# Patient Record
Sex: Male | Born: 1960 | Race: Asian | Hispanic: No | Marital: Married | Smoking: Current every day smoker
Health system: Southern US, Community
[De-identification: ages and names within clinical notes are randomized; demographics above are authoritative.]

## PROBLEM LIST (undated history)

## (undated) HISTORY — PX: NO PAST SURGERIES: SHX2092

---

## 2015-01-15 DIAGNOSIS — L03119 Cellulitis of unspecified part of limb: Secondary | ICD-10-CM

## 2015-01-15 DIAGNOSIS — R791 Abnormal coagulation profile: Secondary | ICD-10-CM

## 2015-01-15 DIAGNOSIS — W5911XA Bitten by nonvenomous snake, initial encounter: Secondary | ICD-10-CM

## 2015-01-15 HISTORY — DX: Abnormal coagulation profile: R79.1

## 2015-01-15 HISTORY — DX: Cellulitis of unspecified part of limb: L03.119

## 2015-01-15 HISTORY — DX: Bitten by nonvenomous snake, initial encounter: W59.11XA

## 2021-01-01 ENCOUNTER — Other Ambulatory Visit: Payer: Self-pay

## 2021-01-02 ENCOUNTER — Ambulatory Visit: Payer: BC Managed Care – PPO | Admitting: Medical

## 2021-01-02 ENCOUNTER — Other Ambulatory Visit (HOSPITAL_BASED_OUTPATIENT_CLINIC_OR_DEPARTMENT_OTHER): Payer: Self-pay

## 2021-01-02 VITALS — BP 137/70 | HR 49 | Resp 18 | Ht 65.0 in | Wt 119.0 lb

## 2021-01-02 DIAGNOSIS — Z23 Encounter for immunization: Secondary | ICD-10-CM | POA: Diagnosis not present

## 2021-01-02 DIAGNOSIS — Z1159 Encounter for screening for other viral diseases: Secondary | ICD-10-CM

## 2021-01-02 DIAGNOSIS — Z0000 Encounter for general adult medical examination without abnormal findings: Secondary | ICD-10-CM

## 2021-01-02 DIAGNOSIS — R001 Bradycardia, unspecified: Secondary | ICD-10-CM

## 2021-01-02 DIAGNOSIS — Z125 Encounter for screening for malignant neoplasm of prostate: Secondary | ICD-10-CM

## 2021-01-02 DIAGNOSIS — Z113 Encounter for screening for infections with a predominantly sexual mode of transmission: Secondary | ICD-10-CM

## 2021-01-02 DIAGNOSIS — Z1211 Encounter for screening for malignant neoplasm of colon: Secondary | ICD-10-CM

## 2021-01-02 DIAGNOSIS — F17200 Nicotine dependence, unspecified, uncomplicated: Secondary | ICD-10-CM | POA: Diagnosis not present

## 2021-01-02 DIAGNOSIS — H538 Other visual disturbances: Secondary | ICD-10-CM

## 2021-01-02 DIAGNOSIS — R9431 Abnormal electrocardiogram [ECG] [EKG]: Secondary | ICD-10-CM

## 2021-01-02 LAB — CBC WITH DIFFERENTIAL/PLATELET
Basophils Absolute: 0 10*3/uL (ref 0.0–0.1)
Basophils Relative: 0.3 % (ref 0.0–3.0)
Eosinophils Absolute: 0.1 10*3/uL (ref 0.0–0.7)
Eosinophils Relative: 1.7 % (ref 0.0–5.0)
HCT: 39.9 % (ref 39.0–52.0)
Hemoglobin: 13.3 g/dL (ref 13.0–17.0)
Lymphocytes Relative: 43.9 % (ref 12.0–46.0)
Lymphs Abs: 2.1 10*3/uL (ref 0.7–4.0)
MCHC: 33.3 g/dL (ref 30.0–36.0)
MCV: 84.2 fl (ref 78.0–100.0)
Monocytes Absolute: 0.4 10*3/uL (ref 0.1–1.0)
Monocytes Relative: 8.8 % (ref 3.0–12.0)
Neutro Abs: 2.2 10*3/uL (ref 1.4–7.7)
Neutrophils Relative %: 45.3 % (ref 43.0–77.0)
Platelets: 311 10*3/uL (ref 150.0–400.0)
RBC: 4.74 Mil/uL (ref 4.22–5.81)
RDW: 14.3 % (ref 11.5–15.5)
WBC: 4.8 10*3/uL (ref 4.0–10.5)

## 2021-01-02 LAB — LIPID PANEL
Cholesterol: 181 mg/dL (ref 0–200)
HDL: 65.7 mg/dL (ref >39.00)
LDL Cholesterol: 103 mg/dL — ABNORMAL HIGH (ref 0–99)
NonHDL: 115.56
Total CHOL/HDL Ratio: 3
Triglycerides: 62 mg/dL (ref 0.0–149.0)
VLDL: 12.4 mg/dL (ref 0.0–40.0)

## 2021-01-02 LAB — COMPREHENSIVE METABOLIC PANEL
ALT: 18 U/L (ref 0–53)
AST: 22 U/L (ref 0–37)
Albumin: 4.2 g/dL (ref 3.5–5.2)
Alkaline Phosphatase: 48 U/L (ref 39–117)
BUN: 12 mg/dL (ref 6–23)
CO2: 28 mEq/L (ref 19–32)
Calcium: 9.1 mg/dL (ref 8.4–10.5)
Chloride: 105 mEq/L (ref 96–112)
Creatinine, Ser: 0.79 mg/dL (ref 0.40–1.50)
GFR: 96.3 mL/min (ref >60.00)
Glucose, Bld: 86 mg/dL (ref 70–99)
Potassium: 4.9 mEq/L (ref 3.5–5.1)
Sodium: 139 mEq/L (ref 135–145)
Total Bilirubin: 0.6 mg/dL (ref 0.2–1.2)
Total Protein: 7.3 g/dL (ref 6.0–8.3)

## 2021-01-02 LAB — TROPONIN I (HIGH SENSITIVITY): High Sens Troponin I: 6 ng/L (ref 2–17)

## 2021-01-02 LAB — PSA: PSA: 1.26 ng/mL (ref 0.10–4.00)

## 2021-01-02 MED ORDER — NICOTINE 14 MG/24HR TD PT24
14.0000 mg | MEDICATED_PATCH | Freq: Every day | TRANSDERMAL | 0 refills | Status: DC
  Filled 2021-01-02: qty 28, 28d supply, fill #0

## 2021-01-02 NOTE — Addendum Note (Signed)
Addended by: Maximino Sarin on: 01/02/2021 10:48 AM   Modules accepted: Orders

## 2021-01-02 NOTE — Progress Notes (Signed)
trop  Subjective:    Patient ID: Donald Nichols, male    DOB: 16-Nov-1960, 60 y.o.   MRN: [id number]  HPI  Pt in for first time. Pt being seen in with vietnamese interpretor.  Pt is fasting.   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  No major medical problems. No meds. No regular exercise. Pt describes moderate healthy diet. Coffee every morning. Cigarrettes- 20 year smoker 1/2 pack a day. 5-6 beers a weeks(on weekends)   On review of systems. He states some blurred vision. No eye pain or pressure. This has been case for 2 years.  Never had colonoscopy.   Pt can't remember when last tetanus was.   Review of Systems  Constitutional:  Negative for chills, fatigue and fever.  HENT:  Negative for congestion, ear discharge, ear pain, postnasal drip, sinus pressure and sinus pain.   Eyes:        Rt eye blurred vision for 2 years.  Respiratory:  Negative for cough, chest tightness, shortness of breath and wheezing.   Cardiovascular:  Negative for chest pain and palpitations.  Gastrointestinal:  Negative for abdominal pain, blood in stool, constipation, nausea and vomiting.  Genitourinary:  Negative for difficulty urinating, flank pain and frequency.  Musculoskeletal:  Negative for arthralgias and gait problem.  Skin:  Negative for rash.    No past medical history on file.   Social History   Socioeconomic History   Marital status: Married    Spouse name: Not on file   Number of children: Not on file   Years of education: Not on file   Highest education level: Not on file  Occupational History   Not on file  Tobacco Use   Smoking status: Not on file   Smokeless tobacco: Not on file  Substance and Sexual Activity   Alcohol use: Not on file   Drug use: Not on file   Sexual activity: Not on file  Other Topics Concern   Not on file  Social History Narrative   Not on file   Social Determinants of Health   Financial Resource Strain: Not on file  Food Insecurity: Not on file   Transportation Needs: Not on file  Physical Activity: Not on file  Stress: Not on file  Social Connections: Not on file  Intimate Partner Violence: Not on file      No family history on file.  Not on File  No current outpatient medications on file prior to visit.   No current facility-administered medications on file prior to visit.    BP (!) 141/80   Pulse (!) 41   Resp 18   Ht 5\' 5"  (1.651 m)   Wt 119 lb (54 kg)   SpO2 99%   BMI 19.80 kg/m       Objective:   Physical Exam  General Mental Status- Alert. General Appearance- Not in acute distress.   Skin General: Color- Normal Color. Moisture- Normal Moisture.  Neck Carotid Arteries- Normal color. Moisture- Normal Moisture. No carotid bruits. No JVD.  Chest and Lung Exam Auscultation: Breath Sounds:-Normal.  Cardiovascular Auscultation:Rythm- Regular. Murmurs & Other Heart Sounds:Auscultation of the heart reveals- No Murmurs.  Abdomen Inspection:-Inspeection Normal. Palpation/Percussion:Note:No mass. Palpation and Percussion of the abdomen reveal- Non Tender, Non Distended + BS, no rebound or guarding.    Neurologic Cranial Nerve exam:- CN III-XII intact(No nystagmus), symmetric smile. Drift Test:- No drift. Romberg Exam:- Negative.  Heal to Toe Gait exam:-Normal. Finger to Nose:- Normal/Intact Strength:- 5/5 equal and symmetric strength  both upper and lower extremities.       Assessment & Plan:  For you wellness exam today I have ordered cbc, cmp, psa and lipid panel.  Vaccine given today. Tdap.  Recommend exercise and healthy diet.  We will let you know lab results as they come in.  Follow up date appointment will be determined after lab review.    Refer to gi for screening colonoscopy.  Counseled to quit smoking. Nicotine patch rx.  Refer to optometrist for vision/evaluate rt eye.   Bradycardia on ekg. Walking some increase to 49    [employer], PA-C  Time spent with patient  today was 50  minutes which consisted of chart review, discussing diagnosis, work up, treatment and documentation.

## 2021-01-02 NOTE — Patient Instructions (Addendum)
For you wellness exam today I have ordered cbc, cmp, psa and lipid panel.  Vaccine given today. Tdap.  Recommend exercise and healthy diet.  We will let you know lab results as they come in.  Follow up date appointment will be determined after lab review.    Refer to gi for screening colonoscopy.  Counseled to quit smoking. Nicotine patch rx.  Refer to optometrist for vision/evaluate rt eye.   Bradycardia on ekg. Walking some increase to 49  sinus bradycardia. Poor r wave progression. No prior to compare. No cardiac symptoms. Will include troponin non lab today. Will also go ahead and refer to cardiologist since hr only wne to 49 walking down hall.     Ch?m [place] phng ng?a 61-64 tu?i, Nam gi?i [employer]ive Care 34-61 Years Old, Male Ch?m [place] phng ng?a ?? c?p ??n cc l?a ch?n l?i s?ng v cc l?n khm v?i chuyn gia ch?m [place] s?c kh?e c th? t?ng c??ng s?c kh?e v h?nh phc. Nh?ng vi?c ny bao g?m: Khm th?c th? hng n?m. L?n khm ny c?ng ???c g?i l ki?m tra s?c kh?e hng n?m. Khm nha khoa v khm m?t th??ng xuyn. Ch?ng ng?a. Sng l?c m?t s? tnh tr?ng nh?t ??nh. L?a ch?n l?i s?ng lnh m?nh, ch?ng h?n nh?: ?n ch? ?? ?n lnh m?nh. T?p th? d?c th??ng xuyn. Khng s? d?ng ma ty ho?c cc s?n ph?m ch?a nicotine v thu?c l. H?n ch? s? d?ng r??u. Ti c th? d? ki?n nh?ng g ??i v?i l?n khm ch?m [place] phng ng?a c?a ti? Khm th?c th? Chuyn gia ch?m [place] s?c kh?e s? ki?m tra qu v? v?: Chi?u cao v cn n?ng. Nh?ng thng s? ny c th? ???c s? d?ng ?? tnh ton BMI (ch? s? kh?i c? th?) c?a qu v?. BMI l m?t s? ?o cho bi?t qu v? c cn n?ng t?t cho s?c kh?e hay khng. Nh?p tim v huy?t p. Thn nhi?t. Da xem c cc ??m b?t th??ng hay khng. T? v?n Chuyn gia ch?m [place] s?c kh?e c th? h?i qu v? v?: Cc v?n ?? b?nh l tr??c ?y. B?nh s? c?a gia ?nh. S? d?ng r??u, thu?c l v ma ty. Tnh tr?ng s?c kh?e tinh th?n. Cu?c s?ng ? nh v tnh tr?ng h?nh phc trong m?i quan h?. Quan h?  tnh d?c. Cc thi quen ?n, t?p luy?n v ng?. Cng vi?c v mi tr??ng lm vi?c. Ti?p c?n v?i v? kh. Ti c?n ch?ng ng?a nh?ng g?  V?c xin th??ng ???c tim ch?ng ? cc ?? tu?i khc nhau, theo l?ch. Chuyn gia ch?m  s?c kh?e s? khuy?n ngh? cc lo?i v?c xin cho qu v? d?a trn tu?i, b?nh s? v l?i s?ng ho?c cc y?u t? khc, ch?ng h?n nh? ?i l?i ho?c n?i qu v?lm vi?c. Ti c?n lm cc xt nghi?m no? Xt nghi?m mu N?ng ?? lipid v cholesterol. Cc ch? s? ny c th? ???c ki?m tra 5 n?m m?t l?n ho?c th??ng xuyn h?n n?u qu v? trn 50 tu?i. Xt nghi?m vim gan C. Xt nghi?m vim gan B. Sng l?c Sng l?c ung th? ph?i. Qu v? c th? ???c lm sng l?c ny m?i n?m m?t l?n b?t ??u lc 60 tu?i n?u qu v? c ti?n s? ht 30 bao thu?c m?i n?m v hi?n nay c ht thu?c ho?c ? b? thu?c trong vng 15 n?m tr??c. Sng l?c ung th? tuy?n ti?n li?t. Cc khuy?n ngh? s? thay ??i ty thu?c vo ti?n s? gia ?nh  v cc nguy c? khc c?a qu v?. Khm b? ph?n sinh d?c ?? ki?m tra xem c ung th? tinh hon ho?c thot v? khng. Sng l?c ung th? ??i tr?c trng. T?t c? ng??i l?n nn th?c hi?n sng l?c ny b?t ??u lc 60 tu?i v ti?p t?c cho ??n 75 tu?i. Chuyn gia ch?m [place] s?c kh?e c?a qu v? c th? khuy?n ngh? sng l?c lc 45 tu?i n?u qu v? b? t?ng nguy c?. Qu v? s? ???c lm cc ki?m tra 1-10 n?m m?t l?n, ty thu?c vo k?t qu? v lo?i ki?m tra sng l?c c?a qu v?. Sng l?c ti?u ???ng. Xt nghi?m ny ???c th?c hi?n b?ng cch ki?m tra ???ng huy?t (glucose) sau khi qu v? khng ?n g trong m?t kho?ng th?i gian (nh?n ?i). Qu v? c th? ???c lm xt nghi?m ny 1-3 n?m m?t l?n. Xt nghi?m STD (b?nh ly truy?n qua ???ng tnh d?c), n?u qu v? c nguy c?. Tun th? nh?ng h??ng d?n ny ? nh: ?n v u?ng  ?n ch? ?? ?n bao g?m tri cy v rau c? t??i, ng? c?c nguyn h?t, protein t? th?t n?c v cc s?n ph?m t? s?a t ch?t bo. Dng th?c ph?m ch?c n?ng c vitamin v ch?t khong theo khuy?n ngh? c?a chuyn gia ch?m [place] s?c  kh?e. Khng u?ng r??u n?u chuyn gia ch?m [place] s?c kh?e c?a qu v? [place] qu v? khng u?ng r??u. N?u qu v? u?ng r??u: Gi?i h?n l??ng r??u qu v? u?ng ? 0-2 ly m?i ngy. Bi?t r m?t ly c bao nhiu r??u. ? M?, m?t ly t??ng ???ng v?i m?t chai bia 12 ao x? (355 mL), m?t ly r??u vang 5 ao x? (148 mL), ho?c m?t ly r??u m?nh 1 ao x? (44 mL).  L?i s?ng Ch?m [place] r?ng v l?i hng ngy. ?nh r?ng m?i sng v t?i b?ng kem ?nh r?ng c flo. Dng ch? nha khoa lm s?ch k? r?ng m?i ngy m?t l?n. N?ng v?n ??ng. T?p th? d?c t nh?t 30 pht, t? 5 ngy tr? ln m?i tu?n. Khng s? d?ng b?t k? s?n ph?m no c nicotine ho?c thu?c l, ch?ng ha?n nh? thu?c l d?ng ht, thu?c l ?i?n t? v thu?c l d?ng nhai. N?u qu v? c?n gip ?? ?? cai thu?c, hy h?i chuyn gia ch?m [place] s?c kh?e. Khng s? d?ng ma ty. N?u qu v? c quan h? tnh d?c, hy th?c hnh quan h? tnh d?c an ton. S? d?ng bao cao su ho?c hnh th?c b?o v? khc ?? phng ng?a STI (nhi?m trng ly truy?n qua ???ng tnh d?c). N?u chuyn gia ch?m [place] s?c kh?e ch? d?n, hy dng aspirin li?u th?p hng ngy b?t ??u t? 50 tu?i. Tm nh?ng cch lnh m?nh ?? ??i ph v?i c?ng th?ng, ch?ng h?n nh?: Thi?n, yoga, ho?c nghe nh?c. Vi?t nh?t k. Ni chuy?n v?i m?t ng??i ?ng tin c?y. Dnh th?i gian cho b?n b v gia ?nh. An ton Lun th?t dy an ton trong khi li xe ho?c ng?i trn ph??ng ti?n ?i l?i. Khng li xe: N?u qu v? ? u?ng r??u. Khng ?i xe cng v?i m?t ng??i v?a u?ng r??u. Khi qu v? m?t m?i ho?c r?i tr. Trong khi nh?n tin. ??i m? b?o hi?m v cc ?? b?o v? khc trong khi tham gia ho?t ??ng th? thao. N?u qu v? c v? kh trong nh, hy ??m b?o qu v? lm theo t?t c? cc quy trnh an ton v?i sng. C?n  lm g ti?p theo? ??n g?p chuyn gia ch?m [place] s?c kh?e m?i n?m m?t l?n ?? ki?m tra s?c kh?e hng n?m. Hy h?i chuyn gia ch?m [place] s?c kh?e v? vi?c qu v? nn khm m?t v r?ng bao lu m?t l?n. Tim v?c xin ??y ??[place] tin ny khng nh?m m?c ?ch thay th? cho  l?i khuyn m chuyn gia ch?m [place] s?c kh?e ni v?i qu v?. Hy b?o ??m qu v? ph?i th?o lu?n b?t k? v?n ?? gm qu v? c v?i chuyn gia ch?m [place] s?c kh?e c?a qu v?. Document Revised: 06/05/2019 Document Reviewed: 06/05/2019 Elsevier Patient Education  2022 [employer].

## 2021-01-05 LAB — HEPATITIS C ANTIBODY
Hepatitis C Ab: NONREACTIVE
SIGNAL TO CUT-OFF: 0.02 (ref <1.00)

## 2021-01-05 LAB — HIV ANTIBODY (ROUTINE TESTING W REFLEX): HIV 1&2 Ab, 4th Generation: NONREACTIVE

## 2021-01-09 ENCOUNTER — Other Ambulatory Visit (HOSPITAL_BASED_OUTPATIENT_CLINIC_OR_DEPARTMENT_OTHER): Payer: Self-pay

## 2021-02-12 ENCOUNTER — Other Ambulatory Visit: Payer: Self-pay

## 2021-02-12 ENCOUNTER — Ambulatory Visit: Payer: BC Managed Care – PPO | Admitting: Cardiology

## 2021-02-12 ENCOUNTER — Ambulatory Visit (INDEPENDENT_AMBULATORY_CARE_PROVIDER_SITE_OTHER): Payer: BC Managed Care – PPO

## 2021-02-12 ENCOUNTER — Encounter: Payer: Self-pay | Admitting: Cardiology

## 2021-02-12 VITALS — BP 122/70 | HR 51 | Ht 62.0 in | Wt 115.0 lb

## 2021-02-12 DIAGNOSIS — R0600 Dyspnea, unspecified: Secondary | ICD-10-CM

## 2021-02-12 DIAGNOSIS — I495 Sick sinus syndrome: Secondary | ICD-10-CM

## 2021-02-12 DIAGNOSIS — R0609 Other forms of dyspnea: Secondary | ICD-10-CM

## 2021-02-12 DIAGNOSIS — R001 Bradycardia, unspecified: Secondary | ICD-10-CM | POA: Diagnosis not present

## 2021-02-12 DIAGNOSIS — F17200 Nicotine dependence, unspecified, uncomplicated: Secondary | ICD-10-CM | POA: Diagnosis not present

## 2021-02-12 NOTE — Progress Notes (Signed)
Cardiology Consultation:    Date:  02/12/2021   ID:  Donald Nichols, DOB [birth year], MRN [id number]  PCP:  Esperanza Richters, PA-C  Cardiologist:  Gypsy Balsam, MD   Referring MD: Marisue Brooklyn   Chief Complaint  Patient presents with   Low HR    PCP referral heart rate low 49    History of Present Illness:    Donald Nichols is a 60 y.o. male who is being seen today for the evaluation of slow heart rate at the request of [place], [place]on [place], [place].  He is a [place] gentleman and [place] interpreter is present during entire visit.  He was referred to [place] because he went to his primary care physician just for regular checkup he was found to be significantly bradycardic the mechanism of bradycardia is sinus.  He said he is doing well he denies have any chest pain tightness squeezing pressure burning chest no dizziness no passing out.  He thinks he is getting old and he jokes that he went to his primary care physician so he can find out if everything is okay.  Denies have any cardiac problem he smokes he does not drink he does not have family history of premature coronary artery disease.  Past Medical History:  Diagnosis Date   Cellulitis of foot 01/15/2015   Elevated partial thromboplastin time (PTT) 01/15/2015   Snake bite 01/15/2015    Past Surgical History:  Procedure Laterality Date   NO PAST SURGERIES      Current Medications: No outpatient medications have been marked as taking for the 02/12/21 encounter (Office Visit) with Georgeanna Lea, MD.     Allergies:   Patient has no known allergies.   Social History   Socioeconomic History   Marital status: Married    Spouse name: Not on file   Number of children: Not on file   Years of education: Not on file   Highest education level: Not on file  Occupational History   Not on file  Tobacco Use   Smoking status: Every Day    Packs/day: 10.00    Types: Cigarettes   Smokeless tobacco: Never  Substance and Sexual  Activity   Alcohol use: Yes    Alcohol/week: 10.0 standard drinks    Types: 10 Cans of beer per week    Comment: Social   Drug use: Never   Sexual activity: Not Currently  Other Topics Concern   Not on file  Social History Narrative   Not on file   Social Determinants of Health   Financial Resource Strain: Not on file  Food Insecurity: Not on file  Transportation Needs: Not on file  Physical Activity: Not on file  Stress: Not on file  Social Connections: Not on file     Family History: The patient's family history includes Cancer in his father and mother; Healthy in his brother and sister. ROS:   Please see the history of present illness.    All 14 point review of systems negative except as described per history of present illness.  EKGs/Labs/Other Studies Reviewed:    The following studies were reviewed today:   EKG:  EKG is  ordered today.  The ekg ordered today demonstrates sinus bradycardia rate 52 normal P interval poor our progression anterior precordium raising suspicion for anterior wall myocardial infarction.  Recent Labs: 01/02/2021: ALT 18; BUN 12; Creatinine, Ser 0.79; Hemoglobin 13.3; Platelets 311.0; Potassium 4.9; Sodium 139  Recent Lipid Panel    Component Value  Date/Time   CHOL 181 01/02/2021 1042   TRIG 62.0 01/02/2021 1042   HDL 65.70 01/02/2021 1042   CHOLHDL 3 01/02/2021 1042   VLDL 12.4 01/02/2021 1042   LDLCALC 103 (H) 01/02/2021 1042    Physical Exam:    VS:  BP 122/70 (BP Location: Right Arm, Patient Position: Sitting)   Pulse (!) 51   Ht 5\' 2"  (1.575 m)   Wt 115 lb (52.2 kg)   SpO2 98%   BMI 21.03 kg/m     Wt Readings from Last 3 Encounters:  02/12/21 115 lb (52.2 kg)  01/02/21 119 lb (54 kg)     GEN:  Well nourished, well developed in no acute distress HEENT: Normal NECK: No JVD; No carotid bruits LYMPHATICS: No lymphadenopathy CARDIAC: RRR, no murmurs, no rubs, no gallops RESPIRATORY:  Clear to auscultation without rales,  wheezing or rhonchi  ABDOMEN: Soft, non-tender, non-distended MUSCULOSKELETAL:  No edema; No deformity  SKIN: Warm and dry NEUROLOGIC:  Alert and oriented x 3 PSYCHIATRIC:  Normal affect   ASSESSMENT:    1. Tachycardia-bradycardia syndrome (HCC)   2. Bradycardia   3. Smoking   4. Dyspnea on exertion    PLAN:    In order of problems listed above:  Sinus bradycardia.  If he appears to be asymptomatic.  I think it will be reasonable to monitor on him to see chronotropic response as well as to see how slow his heart rate goes.  We will make arrangements for a monitor. Part of evaluation also will include echocardiogram I do this especially in view of the fact that he does have EKG which is abnormal showing possibility of anterior septal wall MI. Smoking obviously a problem he understand he need to quit and is trying to work on that.  He does have prescription for nicotine patch [place] exertion: He is doing well from that point review.  Will get echocardiogram to assess left ventricle ejection fraction.   Medication Adjustments/Labs and Tests Ordered: Current medicines are reviewed at length with the patient today.  Concerns regarding medicines are outlined above.  Orders Placed This Encounter  Procedures   LONG TERM MONITOR (3-14 DAYS)   EKG 12-Lead   ECHOCARDIOGRAM COMPLETE   No orders of the defined types were placed in this encounter.   Signed, 03/04/21, MD, [facility]. 02/12/2021 4:59 PM    [facility]

## 2021-02-12 NOTE — Patient Instructions (Addendum)
Medication Instructions:  Your physician recommends that you continue on your current medications as directed. Please refer to the Current Medication list given to you today.  *If you need a refill on your cardiac medications before your next appointment, please call your pharmacy*   Lab Work:  If you have labs (blood work) drawn today and your tests are completely normal, you will receive your results only by: MyChart Message (if you have MyChart) OR A paper copy in the mail If you have any lab test that is abnormal or we need to change your treatment, we will call you to review the results.   Testing/Procedures: Your physician has requested that you have an echocardiogram. Echocardiography is a painless test that uses sound waves to create images of your heart. It provides your doctor with information about the size and shape of your heart and how well your heart's chambers and valves are working. This procedure takes approximately one hour. There are no restrictions for this procedure.   A zio monitor was ordered today. It will remain on for 7 days. You will then return monitor and event diary in provided box. It takes 1-2 weeks for report to be downloaded and returned to [place]. We will call you with the results. If monitor falls off or has orange flashing light, please call Zio for further instructions.    Follow-Up: At [facility], you and your health needs are our priority.  As part of our continuing mission to provide you with exceptional heart care, we have created designated Provider Care Teams.  These Care Teams include your primary Cardiologist (physician) and Advanced Practice Providers (APPs -  Physician Assistants and Nurse Practitioners) who all work together to provide you with the care you need, when you need it.  We recommend signing up for the patient portal called "MyChart".  Sign up information is provided on this After Visit Summary.  MyChart is used to connect with  patients for Virtual Visits (Telemedicine).  Patients are able to view lab/test results, encounter notes, upcoming appointments, etc.  Non-urgent messages can be sent to your provider as well.   To learn more about what you can do with MyChart, go to [email or web address].    Your next appointment:   2 month(s)  The format for your next appointment:   In Person  Provider:   Gypsy Balsam, MD   Other Instructions ZIO AT  Hy g?i ??n cng ty Zio n?u c b?t k? cu h?i no khi ?eo mi?ng dn 24/7: [phone] Nn ?eo mi?ng dn Zio trong 14 ngy tr? khi c ch? d?n khc Cng Ty Zio (iRhythm) s? g?i cho qu v? 24-48 gi? sau khi dn mi?ng dn Zio, hy ch?c ch?n tr? l?i ?i?n tho?i c m vng 224.  H? c th? g?i trong th?i gian qu v? s? d?ng mi?ng dn ?? cung c?p thng tin quan tr?ng lin quan ??n tim c?a qu v?, v v?y hy tr? l?i b?t k? cu?c g?i no t? iRhythm Khng t?m trong vng 24 gi? sau khi dn mi?ng dn Zio (c th? s? d?ng mi?ng b?t bi?n ?? t?m, nh?ng nh? gi? mi?ng dn Zio kh ro) Sau ?, khi t?m, trnh ?? vi hoa sen x? n??c tr?c ti?p vo mi?ng dn Zio, lau kh xung quanh mi?ng dn Trnh ?? m? hi nhi?u Nh?n nt khi qu v? c?m th?y b?t k? tri?u ch?ng no ? tim v ghi l?i chng vo nh?t k ho?c trn ?ng d?ng Zio Tham kh?o  ngy tho mi?ng dn ???c ghi ? m?t tr??c c?a nh?t k tri?u ch?ng v tho mi?ng dn Zio theo h??ng d?n ? m?t sau c?a nh?t k Cho mi?ng dn Zio vo trong my pht tn hi?u m?ch (m?t dnh h??ng ln trn, nt h??ng xu?ng d??i) v ??t c? c?ng v nh?t k tri?u ch?ng vo phong b ? tr? tr??c b?u ph c ? m?t sau c?a my pht.  Cho vo hm th? ho?c b?t k? hm th? USPS no   Siu m tim Echocardiogram Siu m tim l m?t ki?m tra s? d?ng sng m (siu m) ?? t?o ra hnh ?nh c?a tim. Hnh ?nh t? siu m tim c th? cung c?p thng tin quan tr?ng v?: Hnh d?ng v kch th??c tim. Kch th??c v ?? dy c?ng nh? chuy?n ??ng c?a thnh tim. Ch?c n?ng v s?c b?n c?a c? tim. Ch?c  n?ng c?a van tim ho?c xem qu v? c b? h?p van tim hay khng. H?p van tim l khi van tim qu h?p. N?u c mu ch?y ng??c qua cc van tim (h? van tim). M?t kh?i u ho?c nhi?m trng pht tri?n quanh van tim. Cc khu v?c c? tim ho?t ??ng khng t?t v l?u l??ng mu km ho?c t?n th??ng do nh?i mu c? tim. Pht hi?n phnh m?ch. Phnh m?ch l ph?n y?u ho?c b? t?n th??ng c?a thnh ??ng m?ch. Thnh phnh ra do l?c bnh th??ng c?a mu b?m qua c? th?. Hy cho chuyn gia ch?m [place] s?c kh?e bi?t v?: B?t k? d? ?ng no m qu v? c. T?t c? cc lo?i thu?c m qu v? ?ang s? d?ng, bao g?m c? vitamin, th?o d??c, thu?c nh? m?t, thu?c d?ng kem v thu?c khng k ??n. B?t k? b?nh l v? mu no m qu v? c. B?t k? ph?u thu?t no qu v? ? c. B?t k? tnh tr?ng b?nh l no qu v? c. Qu v? c ?ang mang thai ho?c c th? c thai hay khng. C nh?ng nguy c? no? Nhn chung, ?y l m?t ki?m tra an ton. Tuy nhin, cc v?n ?? c th? x?y ra, bao g?m ph?n ?ng d? ?ng v?i thu?c nhu?m (thu?c c?n quang) c th? ???c s? d?ng trong qu trnh ki?m tra. ?i?u g x?y ra tr??c khi lm ki?m tra ny? Khng c?n ph?i chu?n b? g ??c bi?t. Qu v? c th? ?n v u?ng nh? bnh th??ng. ?i?u g x?y ra trong qu trnh th?c hi?n ki?m tra ny?  Qu v? s? c?i qu?n o t? th?t l?ng tr? ln v m?c o chong b?nh vi?n. Cc ?i?n c?c ho?c mi?ng dn ?i?n tm ?? (ECG)c th? ???c ??t ln ng?c qu v?. Cc ?i?n c?c ho?c mi?ng dn sau ? ???c k?t n?i v?i m?t thi?t b? theo di t?c ?? v nh?p ??p c?a tim qu v?. Qu v? s? n?m ln m?t chi?c bn ?? ki?m tra b?ng siu m. M?t lo?i gel s? ???c bi ln ng?c c?a qu v? nh?m gip sng m ?i qua da. M?t thi?t b? c?m tay, g?i l ??u d, s? ???c ?n vo ng?c qu v? v di chuy?n trn tim c?a qu v?. ??u d pht ra cc sng m di chuy?n ??n tim qu v? v d?i l?i (hay "d?i m" tr? l?i) v? b? ??u d. Nh?ng sng m ny s? ???c ghi l?i theo th?i gian th?c v ???c chuy?n thnh hnh ?nh tim c?a qu v? c th? ???c xem trn mn hnh  video.  Cc hnh ?nh s? ???c ghi l?i trn my tnh v ???c chuyn gia ch?m [place] s?c kh?e xem xt. Qu v? c th? ???c yu c?u thay ??i t? th? ho?c nn th? trong th?i gian ng?n. ?i?u ny gip d? dng c ???c nhi?u gc nhn khc nhau ho?c c gc nhn t?t h?n v? tim qu v?. Trong m?t s? tr??ng h?p, qu v? c th? ???c tim thu?c c?n quang qua ???ng t?nh m?ch (IV) vo m?t trong cc t?nh m?ch c?a qu v?. Vi?c ny c th? c?i thi?n ch?t l??ng hnh ?nh c?a tim qu v?. Th? thu?t ny c th? khc nhau gi?a cc chuyn gia ch?m [place] s?c kh?e v ccb?nh vi?n. Ti c th? d? ki?n ?i?u g s? x?y ra sau khi th?c hi?n ki?m tra ny? Qu v? c th? tr? l?i cu?c s?ng hng ngy, bnh th??ng, bao g?m ch? ?? ?n, ccho?t ??ng v thu?c, tr? khi chuyn gia ch?m [place] s?c kh?e c yu c?u khc. Tun th? nh?ng h??ng d?n ny ? nh: Qu v? ???c ty  l?y k?t qu? c?a ki?m tra ny. Hy h?i chuyn gia ch?m [place] s?c kh?e ho?c khoa th?c hi?n ki?m tra khi no c k?t qu? c?a qu v?. Tun th? theo t?t c? cc l?n khm l?i. ?i?u ny c vai tr quan tr?ng. Tm t?t Siu m tim l m?t ki?m tra s? d?ng sng m (siu m) ?? t?o ra hnh ?nh c?a tim. Hnh ?nh t? siu m tim c th? cung c?p thng tin quan tr?ng v kch th??c v hnh d?ng tim, ch?c n?ng c? tim, ch?c n?ng van tim v cc v?n ?? khc c th? c v?i tim qu v?. Qu v? khng c?n lm g ?? chu?n b? tr??c khi ti?n hnh vi?c ki?m tra ny. Qu v? c th? ?n v u?ng nh? bnh th??ng. Sau khi k?t thc siu m tim, qu v? c th? tr? l?i cu?c s?ng hng ngy, bnh th??ng, tr? khi chuyn gia ch?m [place] s?c kh?e c yu c?u khc. Thng tin ny khng nh?m m?c ?ch thay th? cho l?i khuyn m chuyn gia ch?m [place] s?c kh?e ni v?i qu v?. Hy b?o ??m qu v? ph?i th?o lu?n b?t k? v?n ?? gm qu v? c v?i chuyn gia ch?m [place] s?c kh?e c?a qu v?. Document Revised: 05/08/2020 Document Reviewed: 05/08/2020 Elsevier Patient Education  2022 [employer].

## 2021-02-20 ENCOUNTER — Ambulatory Visit (HOSPITAL_COMMUNITY): Payer: BC Managed Care – PPO | Attending: Cardiology

## 2021-02-20 ENCOUNTER — Other Ambulatory Visit: Payer: Self-pay

## 2021-02-20 DIAGNOSIS — I495 Sick sinus syndrome: Secondary | ICD-10-CM | POA: Insufficient documentation

## 2021-02-20 LAB — ECHOCARDIOGRAM COMPLETE
Area-P 1/2: 2.82 cm2
S' Lateral: 3.6 cm

## 2021-02-23 ENCOUNTER — Telehealth: Payer: Self-pay

## 2021-02-23 NOTE — Telephone Encounter (Signed)
Pts daughter returning phone call for results, please advise

## 2021-02-23 NOTE — Telephone Encounter (Signed)
Left message on patients voicemail to please return our call.   

## 2021-02-23 NOTE — Telephone Encounter (Signed)
-----   Message from Garwin Brothers, MD sent at 02/20/2021  4:25 PM EDT ----- The results of the study is unremarkable. Please inform patient. I will discuss in detail at next appointment. Cc  primary care/referring physician Garwin Brothers, MD 02/20/2021 4:25 PM

## 2021-02-24 ENCOUNTER — Telehealth: Payer: Self-pay

## 2021-02-24 NOTE — Telephone Encounter (Signed)
-----   Message from Rajan R Revankar, MD sent at 02/20/2021  4:25 PM EDT ----- The results of the study is unremarkable. Please inform patient. I will discuss in detail at next appointment. Cc  primary care/referring physician Rajan R Revankar, MD 02/20/2021 4:25 PM  

## 2021-02-24 NOTE — Telephone Encounter (Signed)
Left message on patients voicemail to please return our call.   

## 2021-02-25 ENCOUNTER — Telehealth: Payer: Self-pay

## 2021-02-25 NOTE — Telephone Encounter (Signed)
Left message on patients voicemail to please return our call.   Letter mailed to patient at this time.  

## 2021-02-25 NOTE — Telephone Encounter (Signed)
-----   Message from Rajan R Revankar, MD sent at 02/20/2021  4:25 PM EDT ----- The results of the study is unremarkable. Please inform patient. I will discuss in detail at next appointment. Cc  primary care/referring physician Rajan R Revankar, MD 02/20/2021 4:25 PM  

## 2021-04-28 ENCOUNTER — Ambulatory Visit: Payer: BC Managed Care – PPO | Admitting: Cardiology

## 2021-04-28 ENCOUNTER — Other Ambulatory Visit: Payer: Self-pay

## 2021-04-28 ENCOUNTER — Encounter: Payer: Self-pay | Admitting: Cardiology

## 2021-04-28 VITALS — BP 130/72 | HR 55 | Ht 62.0 in | Wt 119.0 lb

## 2021-04-28 DIAGNOSIS — F17200 Nicotine dependence, unspecified, uncomplicated: Secondary | ICD-10-CM | POA: Diagnosis not present

## 2021-04-28 DIAGNOSIS — R0609 Other forms of dyspnea: Secondary | ICD-10-CM

## 2021-04-28 DIAGNOSIS — R001 Bradycardia, unspecified: Secondary | ICD-10-CM | POA: Diagnosis not present

## 2021-04-28 NOTE — Progress Notes (Signed)
Cardiology Office Note:    Date:  04/28/2021   ID:  Donald Nichols, DOB Sep 13, 1960, MRN [id number]  PCP:  Esperanza Richters, PA-C  Cardiologist:  Gypsy Balsam, MD    Referring MD: Marisue Brooklyn   Chief Complaint  Patient presents with   Follow-up  He is doing fine  History of Present Illness:    Donald Nichols is a 60 y.o. male who is [place]  We have interpreter with him.  He was sent to me because of bradycardia the mechanism is sinus.  He never got any syncope he does not get any dizziness I put monitor on him which showed no critical bradycardia.  Only some insignificant supraventricular tachycardia which was asymptomatic.  He did have echocardiogram showed ejection fraction Lower limits of normal surprisingly there is description of abnormal septal motion.  He does not have intraventricular conduction delay. The goodness is the fact that he quit smoking and I congratulated him for it.  And I encouraged him to stay away from smoking.  I also told him that quitting smoking reduce chances for having heart from by 50%.  Because of long-term smoking as well as borderline risk for 10 years coronary event I offered him calcium score it will serve to functions we can take a look at his lung parenchyma as well as his coronary calcium.  Past Medical History:  Diagnosis Date   Cellulitis of foot 01/15/2015   Elevated partial thromboplastin time (PTT) 01/15/2015   Snake bite 01/15/2015    Past Surgical History:  Procedure Laterality Date   NO PAST SURGERIES      Current Medications: No outpatient medications have been marked as taking for the 04/28/21 encounter (Office Visit) with Georgeanna Lea, MD.     Allergies:   Patient has no known allergies.   Social History   Socioeconomic History   Marital status: Married    Spouse name: Not on file   Number of children: Not on file   Years of education: Not on file   Highest education level: Not on file  Occupational History    Not on file  Tobacco Use   Smoking status: Every Day    Packs/day: 10.00    Types: Cigarettes   Smokeless tobacco: Never  Substance and Sexual Activity   Alcohol use: Yes    Alcohol/week: 10.0 standard drinks    Types: 10 Cans of beer per week    Comment: Social   Drug use: Never   Sexual activity: Not Currently  Other Topics Concern   Not on file  Social History Narrative   Not on file   Social Determinants of Health   Financial Resource Strain: Not on file  Food Insecurity: Not on file  Transportation Needs: Not on file  Physical Activity: Not on file  Stress: Not on file  Social Connections: Not on file     Family History: The patient's family history includes Cancer in his father and mother; Healthy in his brother and sister. ROS:   Please see the history of present illness.    All 14 point review of systems negative except as described per history of present illness  EKGs/Labs/Other Studies Reviewed:      Recent Labs: 01/02/2021: ALT 18; BUN 12; Creatinine, Ser 0.79; Hemoglobin 13.3; Platelets 311.0; Potassium 4.9; Sodium 139  Recent Lipid Panel    Component Value Date/Time   CHOL 181 01/02/2021 1042   TRIG 62.0 01/02/2021 1042   HDL 65.70 01/02/2021 1042  CHOLHDL 3 01/02/2021 1042   VLDL 12.4 01/02/2021 1042   LDLCALC 103 (H) 01/02/2021 1042    Physical Exam:    VS:  BP 130/72 (BP Location: Left Arm, Patient Position: Sitting)   Pulse (!) 55   Ht 5\' 2"  (1.575 m)   Wt 119 lb (54 kg)   SpO2 97%   BMI 21.77 kg/m     Wt Readings from Last 3 Encounters:  04/28/21 119 lb (54 kg)  02/12/21 115 lb (52.2 kg)  01/02/21 119 lb (54 kg)     GEN:  Well nourished, well developed in no acute distress HEENT: Normal NECK: No JVD; No carotid bruits LYMPHATICS: No lymphadenopathy CARDIAC: RRR, no murmurs, no rubs, no gallops RESPIRATORY:  Clear to auscultation without rales, wheezing or rhonchi  ABDOMEN: Soft, non-tender, non-distended MUSCULOSKELETAL:  No  edema; No deformity  SKIN: Warm and dry LOWER EXTREMITIES: no swelling NEUROLOGIC:  Alert and oriented x 3 PSYCHIATRIC:  Normal affect   ASSESSMENT:    1. Bradycardia   2. Smoking   3. Dyspnea on exertion    PLAN:    In order of problems listed above:  Bradycardia insignificant.  Lowest heart rate 30 8 at night.  He does not have any symptoms we will continue monitoring obviously we will avoid any sinus suppressing agent. Smoking luckily he quit I encouraged him to stay away from smoking. Dyspnea on exertion echocardiogram showed ejection fraction lower limits of normal Multiple risk factors for coronary artery disease with borderline at 10 years predicted risk I will schedule him to have a calcium score to help with the management and based on that we will decide if we need to put him on statin therapy.  I did review his K PN which show me his LDL of 103 and his HDL 65.   Medication Adjustments/Labs and Tests Ordered: Current medicines are reviewed at length with the patient today.  Concerns regarding medicines are outlined above.  Orders Placed This Encounter  Procedures   CT CARDIAC SCORING (SELF PAY ONLY)   Medication changes: No orders of the defined types were placed in this encounter.   Signed, 03/04/21, MD, [facility] 04/28/2021 8:41 AM    [facility]

## 2021-04-28 NOTE — Patient Instructions (Signed)
Medication Instructions:  Your physician recommends that you continue on your current medications as directed. Please refer to the Current Medication list given to you today.  *If you need a refill on your cardiac medications before your next appointment, please call your pharmacy*   Lab Work: None If you have labs (blood work) drawn today and your tests are completely normal, you will receive your results only by: MyChart Message (if you have MyChart) OR A paper copy in the mail If you have any lab test that is abnormal or we need to change your treatment, we will call you to review the results.   Testing/Procedures: Non-Cardiac CT scanning, (CAT scanning), is a noninvasive, special x-ray that produces cross-sectional images of the body using x-rays and a computer. CT scans help physicians diagnose and treat medical conditions. For some CT exams, a contrast material is used to enhance visibility in the area of the body being studied. CT scans provide greater clarity and reveal more details than regular x-ray exams.    Follow-Up: At [facility], you and your health needs are our priority.  As part of our continuing mission to provide you with exceptional heart care, we have created designated Provider Care Teams.  These Care Teams include your primary Cardiologist (physician) and Advanced Practice Providers (APPs -  Physician Assistants and Nurse Practitioners) who all work together to provide you with the care you need, when you need it.  We recommend signing up for the patient portal called "MyChart".  Sign up information is provided on this After Visit Summary.  MyChart is used to connect with patients for Virtual Visits (Telemedicine).  Patients are able to view lab/test results, encounter notes, upcoming appointments, etc.  Non-urgent messages can be sent to your provider as well.   To learn more about what you can do with MyChart, go to [email or web address].    Your next  appointment:   6 month(s)  The format for your next appointment:   In Person  Provider:   Robert Krasowski, MD    Other Instructions    

## 2021-05-01 ENCOUNTER — Other Ambulatory Visit: Payer: Self-pay | Admitting: Cardiology

## 2021-05-01 DIAGNOSIS — I2510 Atherosclerotic heart disease of native coronary artery without angina pectoris: Secondary | ICD-10-CM

## 2021-05-06 ENCOUNTER — Ambulatory Visit (INDEPENDENT_AMBULATORY_CARE_PROVIDER_SITE_OTHER)
Admission: RE | Admit: 2021-05-06 | Discharge: 2021-05-06 | Disposition: A | Discharge status: Home / Self Care | Payer: Self-pay | Source: Ambulatory Visit | Attending: Cardiology | Admitting: Cardiology

## 2021-05-06 ENCOUNTER — Other Ambulatory Visit: Payer: Self-pay

## 2021-05-06 DIAGNOSIS — I2510 Atherosclerotic heart disease of native coronary artery without angina pectoris: Secondary | ICD-10-CM

## 2021-06-26 ENCOUNTER — Ambulatory Visit: Payer: BC Managed Care – PPO | Admitting: Cardiology

## 2021-06-26 ENCOUNTER — Encounter: Payer: Self-pay | Admitting: Cardiology

## 2021-06-26 ENCOUNTER — Other Ambulatory Visit (HOSPITAL_BASED_OUTPATIENT_CLINIC_OR_DEPARTMENT_OTHER): Payer: Self-pay

## 2021-06-26 ENCOUNTER — Other Ambulatory Visit: Payer: Self-pay

## 2021-06-26 VITALS — BP 138/80 | HR 57 | Ht 63.0 in | Wt 125.0 lb

## 2021-06-26 DIAGNOSIS — R001 Bradycardia, unspecified: Secondary | ICD-10-CM | POA: Diagnosis not present

## 2021-06-26 DIAGNOSIS — F17200 Nicotine dependence, unspecified, uncomplicated: Secondary | ICD-10-CM

## 2021-06-26 DIAGNOSIS — E785 Hyperlipidemia, unspecified: Secondary | ICD-10-CM

## 2021-06-26 DIAGNOSIS — R0609 Other forms of dyspnea: Secondary | ICD-10-CM | POA: Diagnosis not present

## 2021-06-26 DIAGNOSIS — R931 Abnormal findings on diagnostic imaging of heart and coronary circulation: Secondary | ICD-10-CM

## 2021-06-26 MED ORDER — ATORVASTATIN CALCIUM 10 MG PO TABS
10.0000 mg | ORAL_TABLET | Freq: Every day | ORAL | 1 refills | Status: AC
  Filled 2021-06-26: qty 90, 90d supply, fill #0

## 2021-06-26 NOTE — Progress Notes (Signed)
Cardiology Office Note:    Date:  06/26/2021   ID:  Donald Nichols, DOB May 04, 1961, MRN [id number]  PCP:  Esperanza Richters, PA-C  Cardiologist:  Gypsy Balsam, MD    Referring MD: Marisue Brooklyn   Chief Complaint  Patient presents with   Follow-up  Doing well  History of Present Illness:    Donald Nichols is a 60 y.o. male who was sent initially to [place] because of bradycardia.  Monitor has been placed mechanism of bradycardia is sinus.  He is completely asymptomatic does not have critical bradycardia.  No need to intervene except monitoring.  He was a smoker however quit smoking before my last visit with him, I congratulated him again for it, he did have elevation of cholesterol calculated risk were borderline.  Calcium score has been performed which showed 271.  He comes here today to talk about this.  Likely he does not have any signs and symptoms of active obstructive coronary artery disease.  There is no chest pain tightness squeezing pressure burning chest.  He is able to walk quite heavily and have no difficulty doing it.  Past Medical History:  Diagnosis Date   Cellulitis of foot 01/15/2015   Elevated partial thromboplastin time (PTT) 01/15/2015   Snake bite 01/15/2015    Past Surgical History:  Procedure Laterality Date   NO PAST SURGERIES      Current Medications: No outpatient medications have been marked as taking for the 06/26/21 encounter (Office Visit) with Georgeanna Lea, MD.     Allergies:   Patient has no known allergies.   Social History   Socioeconomic History   Marital status: Married    Spouse name: Not on file   Number of children: Not on file   Years of education: Not on file   Highest education level: Not on file  Occupational History   Not on file  Tobacco Use   Smoking status: Every Day    Packs/day: 10.00    Types: Cigarettes   Smokeless tobacco: Never  Substance and Sexual Activity   Alcohol use: Yes    Alcohol/week: 10.0 standard drinks     Types: 10 Cans of beer per week    Comment: Social   Drug use: Never   Sexual activity: Not Currently  Other Topics Concern   Not on file  Social History Narrative   Not on file   Social Determinants of Health   Financial Resource Strain: Not on file  Food Insecurity: Not on file  Transportation Needs: Not on file  Physical Activity: Not on file  Stress: Not on file  Social Connections: Not on file     Family History: The patient's family history includes Cancer in his father and mother; Healthy in his brother and sister. ROS:   Please see the history of present illness.    All 14 point review of systems negative except as described per history of present illness  EKGs/Labs/Other Studies Reviewed:      Recent Labs: 01/02/2021: ALT 18; BUN 12; Creatinine, Ser 0.79; Hemoglobin 13.3; Platelets 311.0; Potassium 4.9; Sodium 139  Recent Lipid Panel    Component Value Date/Time   CHOL 181 01/02/2021 1042   TRIG 62.0 01/02/2021 1042   HDL 65.70 01/02/2021 1042   CHOLHDL 3 01/02/2021 1042   VLDL 12.4 01/02/2021 1042   LDLCALC 103 (H) 01/02/2021 1042    Physical Exam:    VS:  BP 138/80 (BP Location: Left Arm, Patient Position: Sitting)  Pulse (!) 57   Ht 5\' 3"  (1.6 m)   Wt 125 lb (56.7 kg)   SpO2 94%   BMI 22.14 kg/m     Wt Readings from Last 3 Encounters:  06/26/21 125 lb (56.7 kg)  04/28/21 119 lb (54 kg)  02/12/21 115 lb (52.2 kg)     GEN:  Well nourished, well developed in no acute distress HEENT: Normal NECK: No JVD; No carotid bruits LYMPHATICS: No lymphadenopathy CARDIAC: RRR, no murmurs, no rubs, no gallops RESPIRATORY:  Clear to auscultation without rales, wheezing or rhonchi  ABDOMEN: Soft, non-tender, non-distended MUSCULOSKELETAL:  No edema; No deformity  SKIN: Warm and dry LOWER EXTREMITIES: no swelling NEUROLOGIC:  Alert and oriented x 3 PSYCHIATRIC:  Normal affect   ASSESSMENT:    1. Bradycardia   2. Smoking   3. Dyspnea on exertion    4. Dyslipidemia   5. Elevated coronary artery calcium score to 71    PLAN:    In order of problems listed above:  Sinus bradycardia asymptomatic.  We will continue monitoring I warned him if he develop dizziness or passing out he is to let me know immediately. Smoking he quit and I congratulated him again I asked him to stay away from smoking. Dyspnea on exertion improving after quitting smoking.  Echocardiogram showed ejection fraction 5055% which is low normal Dyslipidemia he is at risk for borderline calcium score however elevated 271, will start him with Lipitor 10 mg daily, fasting lipid profile AST ALT he will be done in 6 weeks.  I explained to him risks and benefits of this medication he is willing to start.   Medication Adjustments/Labs and Tests Ordered: Current medicines are reviewed at length with the patient today.  Concerns regarding medicines are outlined above.  No orders of the defined types were placed in this encounter.  Medication changes: No orders of the defined types were placed in this encounter.   Signed, 02/14/21, MD, [facility] 06/26/2021 8:36 AM    [facility]

## 2021-06-26 NOTE — Patient Instructions (Signed)
Medication Instructions:  Your physician has recommended you make the following change in your medication:  START: lipitor 10 mg daily   *If you need a refill on your cardiac medications before your next appointment, please call your pharmacy*   Lab Work: Your physician recommends that you return for lab work 6 weeks from now fasting:  lipid, lft  If you have labs (blood work) drawn today and your tests are completely normal, you will receive your results only by: MyChart Message (if you have MyChart) OR A paper copy in the mail If you have any lab test that is abnormal or we need to change your treatment, we will call you to review the results.   Testing/Procedures: None   Follow-Up: At [facility], you and your health needs are our priority.  As part of our continuing mission to provide you with exceptional heart care, we have created designated Provider Care Teams.  These Care Teams include your primary Cardiologist (physician) and Advanced Practice Providers (APPs -  Physician Assistants and Nurse Practitioners) who all work together to provide you with the care you need, when you need it.  We recommend signing up for the patient portal called "MyChart".  Sign up information is provided on this After Visit Summary.  MyChart is used to connect with patients for Virtual Visits (Telemedicine).  Patients are able to view lab/test results, encounter notes, upcoming appointments, etc.  Non-urgent messages can be sent to your provider as well.   To learn more about what you can do with MyChart, go to [email or web address].    Your next appointment:   6 month(s)  The format for your next appointment:   In Person  Provider:   Gypsy Balsam, MD    Other Instructions Atorvastatin Tablets What is this medication? ATORVASTATIN (a TORE va sta tin) treats high cholesterol and reduces the risk of heart attack and stroke. It works by decreasing bad cholesterol and fats (such as LDL,  triglycerides) and increasing good cholesterol (HDL) in your blood. It belongs to a group of medications called statins. Changes to diet and exercise are often combined with this medication. This medicine may be used for other purposes; ask your health care provider or pharmacist if you have questions. COMMON BRAND NAME(S): Lipitor What should I tell my care team before I take this medication? They need to know if you have any of these conditions: Diabetes Frequently drink alcohol Kidney disease Liver disease Muscle cramps, pain Stroke Thyroid disease An unusual or allergic reaction to atorvastatin, other medications, foods, dyes, or preservatives Pregnant or trying to get pregnant Breast-feeding How should I use this medication? Take this medication by mouth. Take it as directed on the label at the same time every day. You can take it with or without food. If it upsets your stomach, take it with food. Keep taking it unless your care team tells you to stop. Do not take this medication with grapefruit juice. Talk to your care team about the use of this medication in children. While it may be prescribed for children as young as 10 for selected conditions, precautions do apply. Overdosage: If you think you have taken too much of this medicine contact a poison control center or emergency room at once. NOTE: This medicine is only for you. Do not share this medicine with others. What if I miss a dose? If you miss a dose, take it as soon as you can. If it is almost time for your next dose,  take only that dose. Do not take double or extra doses. What may interact with this medication? Do not take this medication with any of the following: Dasabuvir; ombitasvir; paritaprevir; ritonavir Ombitasvir; paritaprevir; ritonavir Posaconazole Red yeast rice This medication may also interact with the following: Alcohol Certain antibiotics like erythromycin and clarithromycin Certain antivirals for HIV  or hepatitis Certain medications for cholesterol like fenofibrate, gemfibrozil, and niacin Certain medications for fungal infections like ketoconazole and itraconazole Colchicine Cyclosporine Digoxin Estrogen or progestin hormones Grapefruit juice Rifampin This list may not describe all possible interactions. Give your health care provider a list of all the medicines, herbs, non-prescription drugs, or dietary supplements you use. Also tell them if you smoke, drink alcohol, or use illegal drugs. Some items may interact with your medicine. What should I watch for while using this medication? Visit your care team for regular checks on your progress. Tell your care team if your symptoms do not start to get better or if they get worse. Your care team may tell you to stop taking this medication if you develop muscle problems. If your muscle problems do not go away after stopping this medication, contact your care team. Talk to your care team if you wish to become pregnant or think you might be pregnant. This medication can cause serious birth defects. Talk to your care team before breastfeeding. Changes to your treatment plan may be needed. This medication may increase blood sugar. Ask your care team if changes in diet or medications are needed if you have diabetes. If you are going to need surgery or other procedure, tell your care team that you are using this medication. Taking this medication is only part of a total heart healthy program. Ask your care team if there are other changes you can make to improve your overall health. What side effects may I notice from receiving this medication? Side effects that you should report to your care team as soon as possible: Allergic reactions--skin rash, itching, hives, swelling of the face, lips, tongue, or throat High blood sugar (hyperglycemia)--increased thirst or amount of urine, unusual weakness, fatigue, blurry vision Liver injury--right upper belly  pain, loss of appetite, nausea, light-colored stool, dark yellow or brown urine, yellowing skin or eyes, unusual weakness, fatigue Muscle injury--unusual weakness, fatigue, muscle pain, dark yellow or brown urine, decrease in amount of urine Redness, blistering, peeling, or loosening of the skin, including inside the mouth Side effects that usually do not require medical attention (report to your care team if they continue or are bothersome): Diarrhea Nausea Trouble sleeping Upset stomach This list may not describe all possible side effects. Call your doctor for medical advice about side effects. You may report side effects to FDA at 1-800-FDA-1088. Where should I keep my medication? Keep out of the reach of children and pets. Store at room temperature between 20 and 25 degrees C (68 and 77 degrees F). Get rid of any unused medication after the expiration date. To get rid of medications that are no longer needed or have expired: Take the medication to a medication take-back program. Check with your pharmacy or law enforcement to find a location. If you cannot return the medication, check the label or package insert to see if the medication should be thrown out in the garbage or flushed down the toilet. If you are not sure, ask your care team. If it is safe to put it in the trash, take the medication out of the container. Mix the medication with  cat litter, dirt, coffee grounds, or other unwanted substance. Seal the mixture in a bag or container. Put it in the trash. NOTE: This sheet is a summary. It may not cover all possible information. If you have questions about this medicine, talk to your doctor, pharmacist, or health care provider.  2022 Elsevier/Gold Standard (2021-01-12 00:00:00)

## 2021-08-21 DIAGNOSIS — F17200 Nicotine dependence, unspecified, uncomplicated: Secondary | ICD-10-CM | POA: Diagnosis not present

## 2021-08-21 DIAGNOSIS — E785 Hyperlipidemia, unspecified: Secondary | ICD-10-CM | POA: Diagnosis not present

## 2021-08-21 DIAGNOSIS — R001 Bradycardia, unspecified: Secondary | ICD-10-CM | POA: Diagnosis not present

## 2021-08-21 DIAGNOSIS — R0609 Other forms of dyspnea: Secondary | ICD-10-CM | POA: Diagnosis not present

## 2021-08-22 LAB — HEPATIC FUNCTION PANEL
ALT: 19 IU/L (ref 0–44)
AST: 26 IU/L (ref 0–40)
Albumin: 4.4 g/dL (ref 3.8–4.8)
Alkaline Phosphatase: 54 IU/L (ref 44–121)
Bilirubin Total: 0.6 mg/dL (ref 0.0–1.2)
Bilirubin, Direct: 0.19 mg/dL (ref 0.00–0.40)
Total Protein: 6.7 g/dL (ref 6.0–8.5)

## 2021-08-22 LAB — LIPID PANEL
Chol/HDL Ratio: 2.1 ratio (ref 0.0–5.0)
Cholesterol, Total: 136 mg/dL (ref 100–199)
HDL: 64 mg/dL (ref >39)
LDL Chol Calc (NIH): 62 mg/dL (ref 0–99)
Triglycerides: 41 mg/dL (ref 0–149)
VLDL Cholesterol Cal: 10 mg/dL (ref 5–40)

## 2021-08-26 ENCOUNTER — Telehealth: Payer: Self-pay

## 2021-08-26 NOTE — Telephone Encounter (Signed)
-----   Message from Park Liter, MD sent at 08/26/2021  1:20 PM EST ----- Cholesterol looks perfect, liver function test normal.

## 2021-08-26 NOTE — Telephone Encounter (Signed)
Spoke with An Schreiner (ok per DPR) notified of results

## 2021-10-01 ENCOUNTER — Ambulatory Visit: Payer: BC Managed Care – PPO | Admitting: Cardiology

## 2021-12-17 ENCOUNTER — Ambulatory Visit: Payer: BC Managed Care – PPO | Admitting: Cardiology

## 2022-02-03 ENCOUNTER — Telehealth: Payer: Self-pay

## 2022-02-03 ENCOUNTER — Telehealth: Payer: Self-pay | Admitting: Cardiology

## 2022-02-03 NOTE — Telephone Encounter (Signed)
Daughter calling to see if our office can referred patient to an eye dr. Please advise

## 2022-02-03 NOTE — Telephone Encounter (Signed)
Left message for daughter to call PCP for eye doctor referral

## 2022-03-12 ENCOUNTER — Encounter: Payer: Self-pay | Admitting: Cardiology

## 2022-03-12 ENCOUNTER — Ambulatory Visit: Payer: BC Managed Care – PPO | Admitting: Cardiology

## 2022-03-12 VITALS — BP 146/80 | HR 46 | Ht 64.0 in | Wt 122.0 lb

## 2022-03-12 DIAGNOSIS — E785 Hyperlipidemia, unspecified: Secondary | ICD-10-CM

## 2022-03-12 DIAGNOSIS — F17200 Nicotine dependence, unspecified, uncomplicated: Secondary | ICD-10-CM

## 2022-03-12 DIAGNOSIS — R931 Abnormal findings on diagnostic imaging of heart and coronary circulation: Secondary | ICD-10-CM

## 2022-03-12 DIAGNOSIS — R001 Bradycardia, unspecified: Secondary | ICD-10-CM | POA: Diagnosis not present

## 2022-03-12 NOTE — Patient Instructions (Addendum)
Medication Instructions:  Your physician has recommended you make the following change in your medication:   81mg  Enteric Coated Baby Aspirin 1 tablet daily by mouth    Lab Work: None Ordered If you have labs (blood work) drawn today and your tests are completely normal, you will receive your results only by: MyChart Message (if you have MyChart) OR A paper copy in the mail If you have any lab test that is abnormal or we need to change your treatment, we will call you to review the results.   Testing/Procedures: None Ordered   Follow-Up: At [facility], you and your health needs are our priority.  As part of our continuing mission to provide you with exceptional heart care, we have created designated Provider Care Teams.  These Care Teams include your primary Cardiologist (physician) and Advanced Practice Providers (APPs -  Physician Assistants and Nurse Practitioners) who all work together to provide you with the care you need, when you need it.  We recommend signing up for the patient portal called "MyChart".  Sign up information is provided on this After Visit Summary.  MyChart is used to connect with patients for Virtual Visits (Telemedicine).  Patients are able to view lab/test results, encounter notes, upcoming appointments, etc.  Non-urgent messages can be sent to your provider as well.   To learn more about what you can do with MyChart, go to [facility].    Your next appointment:   12 month(s)  The format for your next appointment:   In Person  Provider:   [email or web address], MD    Other Instructions Eye exam at [facility]

## 2022-03-12 NOTE — Progress Notes (Signed)
Cardiology Office Note:    Date:  03/12/2022   ID:  Donald Nichols, DOB 01/27/1961, MRN [id number]  PCP:  Esperanza Richters, PA-C  Cardiologist:  Gypsy Balsam, MD    Referring MD: Marisue Brooklyn   No chief complaint on file. Doing fine  History of Present Illness:    Donald Nichols is a 61 y.o. male with past medical history significant for sinus bradycardia and it was initial reason for referral, essential hypertension, dyslipidemia.  He comes today to my office for follow-up.  Overall doing well.  Denies have any chest pain tightness squeezing pressure burning chest.  He does do them on a fracture and job and have no difficulty doing it  Past Medical History:  Diagnosis Date   Cellulitis of foot 01/15/2015   Elevated partial thromboplastin time (PTT) 01/15/2015   Snake bite 01/15/2015    Past Surgical History:  Procedure Laterality Date   NO PAST SURGERIES      Current Medications: Current Meds  Medication Sig   atorvastatin (LIPITOR) 10 MG tablet Take 1 tablet (10 mg total) by mouth daily.     Allergies:   Patient has no known allergies.   Social History   Socioeconomic History   Marital status: Married    Spouse name: Not on file   Number of children: Not on file   Years of education: Not on file   Highest education level: Not on file  Occupational History   Not on file  Tobacco Use   Smoking status: Every Day    Packs/day: 10.00    Types: Cigarettes   Smokeless tobacco: Never  Substance and Sexual Activity   Alcohol use: Yes    Alcohol/week: 10.0 standard drinks of alcohol    Types: 10 Cans of beer per week    Comment: Social   Drug use: Never   Sexual activity: Not Currently  Other Topics Concern   Not on file  Social History Narrative   Not on file   Social Determinants of Health   Financial Resource Strain: Not on file  Food Insecurity: Not on file  Transportation Needs: Not on file  Physical Activity: Not on file  Stress: Not on file  Social  Connections: Not on file     Family History: The patient's family history includes Cancer in his father and mother; Healthy in his brother and sister. ROS:   Please see the history of present illness.    All 14 point review of systems negative except as described per history of present illness  EKGs/Labs/Other Studies Reviewed:      Recent Labs: 08/21/2021: ALT 19  Recent Lipid Panel    Component Value Date/Time   CHOL 136 08/21/2021 0913   TRIG 41 08/21/2021 0913   HDL 64 08/21/2021 0913   CHOLHDL 2.1 08/21/2021 0913   CHOLHDL 3 01/02/2021 1042   VLDL 12.4 01/02/2021 1042   LDLCALC 62 08/21/2021 0913    Physical Exam:    VS:  BP (!) 146/80 (BP Location: Left Arm, Patient Position: Sitting, Cuff Size: Normal)   Pulse (!) 46   Ht 5\' 4"  (1.626 m)   Wt 122 lb (55.3 kg)   BMI 20.94 kg/m     Wt Readings from Last 3 Encounters:  03/12/22 122 lb (55.3 kg)  06/26/21 125 lb (56.7 kg)  04/28/21 119 lb (54 kg)     GEN:  Well nourished, well developed in no acute distress HEENT: Normal NECK: No JVD; No carotid bruits  LYMPHATICS: No lymphadenopathy CARDIAC: RRR, no murmurs, no rubs, no gallops RESPIRATORY:  Clear to auscultation without rales, wheezing or rhonchi  ABDOMEN: Soft, non-tender, non-distended MUSCULOSKELETAL:  No edema; No deformity  SKIN: Warm and dry LOWER EXTREMITIES: no swelling NEUROLOGIC:  Alert and oriented x 3 PSYCHIATRIC:  Normal affect   ASSESSMENT:    1. Bradycardia   2. 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 Dyslipidemia   4. Smoking    PLAN:    In order of problems listed above:  Bradycardia not critical denies having dizziness or passing out.  Monitor reviewed.  The mechanism of bradycardia sinus I told him if he became dizzy or start having syncope he need to let me know right away Elevated coronary calcium score.  We will ask him to start taking 1 baby aspirin every single day.  We will continue with statin Dyslipidemia statin  is already on board Lipitor 10 I did review K PN from January which show me LDL 62 HDL 64 we will continue Smoking he quit and I encouraged him to stay away from smoking   Medication Adjustments/Labs and Tests Ordered: Current medicines are reviewed at length with the patient today.  Concerns regarding medicines are outlined above.  No orders of the defined types were placed in this encounter.  Medication changes: No orders of the defined types were placed in this encounter.   Signed, Georgeanna Lea, MD, [facility] 03/12/2022 10:25 AM    [facility]

## 2022-05-28 DIAGNOSIS — H35371 Puckering of macula, right eye: Secondary | ICD-10-CM | POA: Diagnosis not present

## 2022-05-28 DIAGNOSIS — H5203 Hypermetropia, bilateral: Secondary | ICD-10-CM | POA: Diagnosis not present

## 2022-05-28 DIAGNOSIS — H43813 Vitreous degeneration, bilateral: Secondary | ICD-10-CM | POA: Diagnosis not present

## 2022-05-28 DIAGNOSIS — H2513 Age-related nuclear cataract, bilateral: Secondary | ICD-10-CM | POA: Diagnosis not present

## 2022-08-18 IMAGING — CT CT CARDIAC CORONARY ARTERY CALCIUM SCORE
3 series · 14 of 20 positions shown, 16 images · non-contrast
Comparison: None.
COMPARISON: None.

Addendum:
EXAM:
OVER-READ INTERPRETATION  CT CHEST

The following report is an over-read performed by radiologist Dr.
over-read does not include interpretation of cardiac or coronary
anatomy or pathology. The calcium score interpretation by the
cardiologist is attached.
CLINICAL DATA: Cardiovascular Disease Risk stratification
Coronary Calcium Score
TECHNIQUE: A gated, non-contrast computed tomography scan of the heart was
performed using 3mm slice thickness. Axial images were analyzed on a
dedicated workstation. Calcium scoring of the coronary arteries was
performed using the Agatston method.

[Series 2: cascseq 2.0 sa36 70% (id) · axial · 0.39mm/px · z∈[-257,-155]mm · 4 of 85 slices shown]
[im 17/85  vessel]
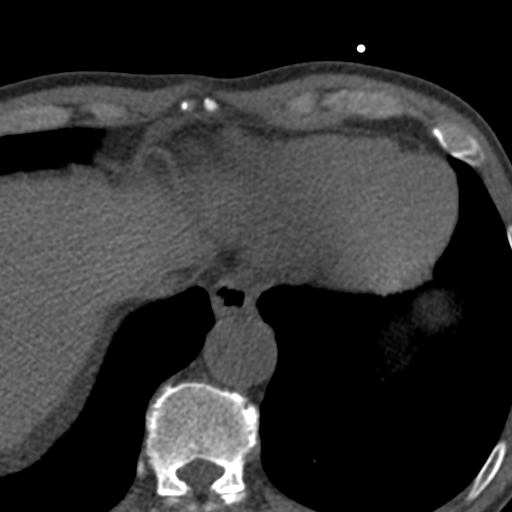
[im 34/85  vessel]
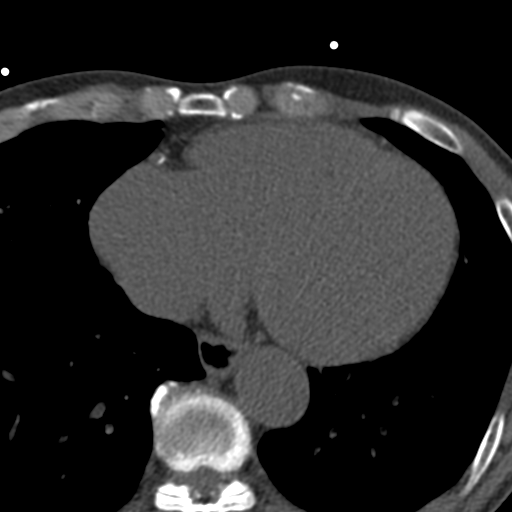
[im 51/85  vessel]
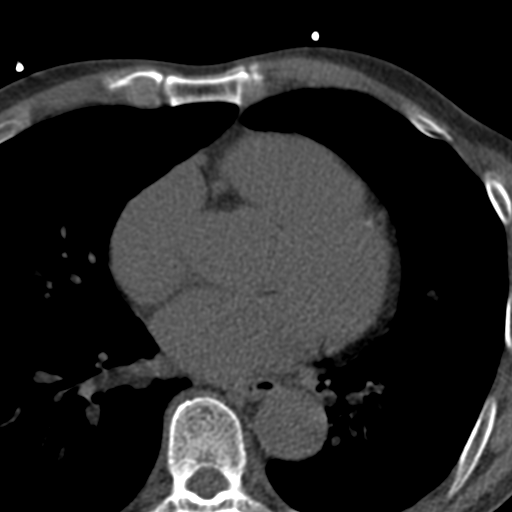
[im 68/85  vessel]
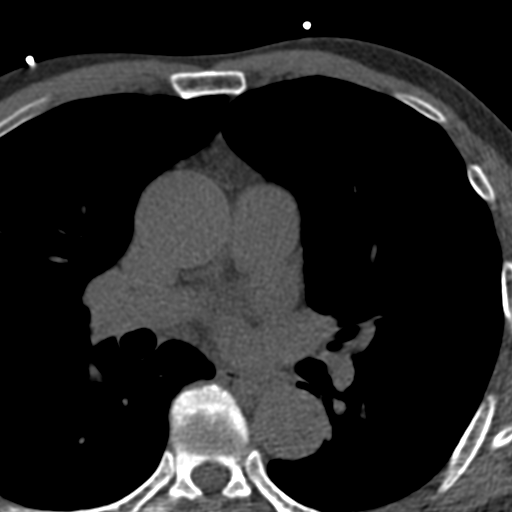

[Series 3: cascseq 2.0 bf37 st · axial · 0.63mm/px · z∈[-261,-149]mm · 5 of 85 slices shown, 7 images]
[im 15/85  vessel]
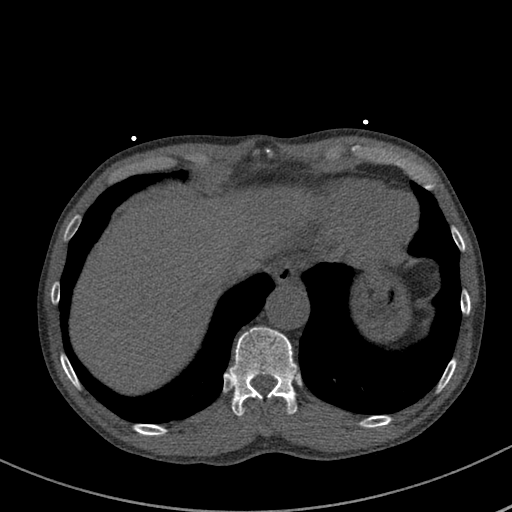
[im 15/85  lung]
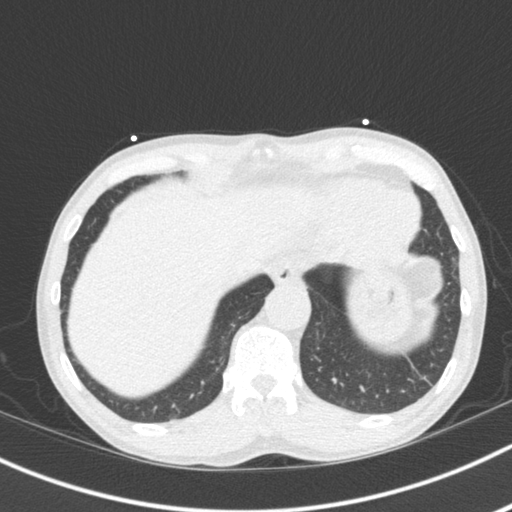
[im 29/85  vessel]
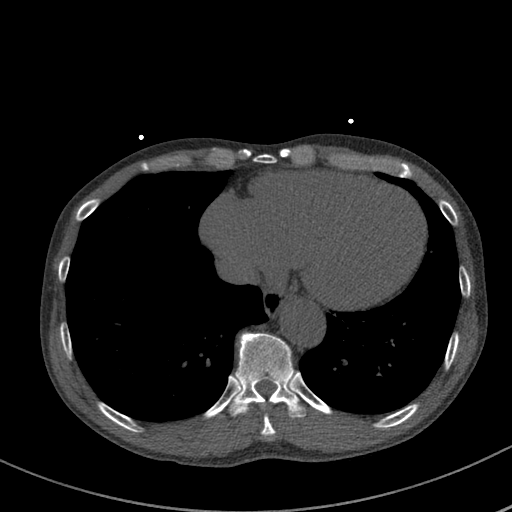
[im 43/85  vessel]
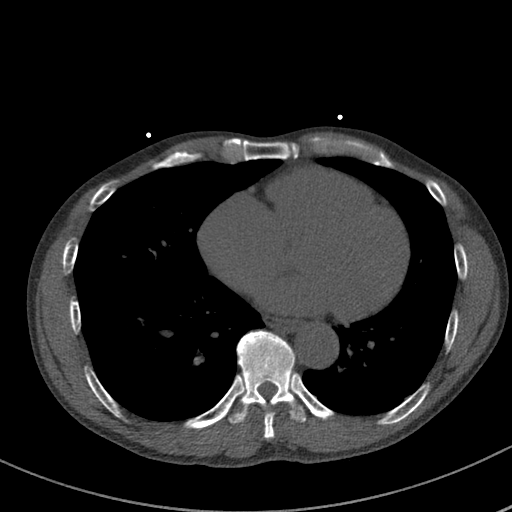
[im 57/85  vessel]
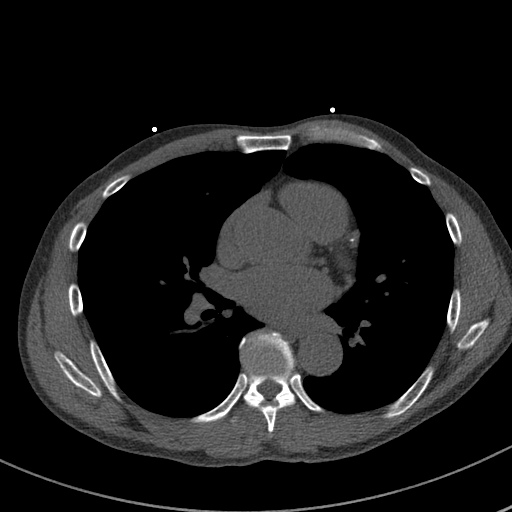
[im 71/85  vessel]
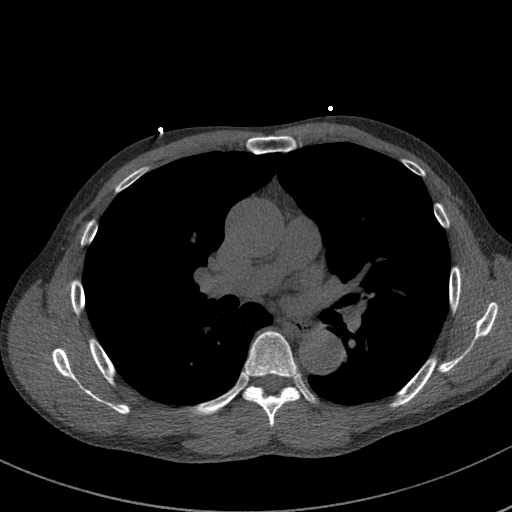
[im 71/85  lung]
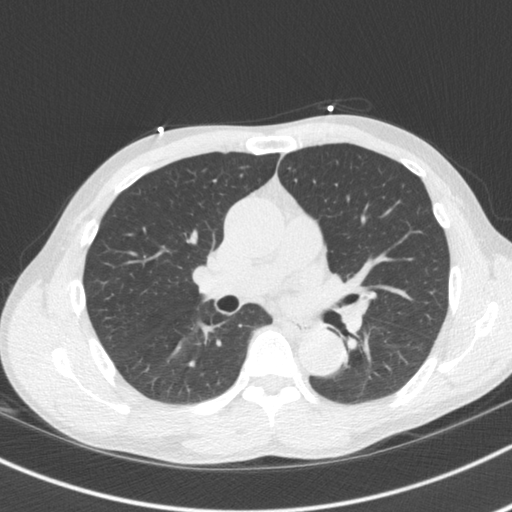

[Series 4: cascseq 2.0 br59 lung · axial · 0.63mm/px · z∈[-261,-149]mm · 5 of 85 slices shown]
[im 15/85  lung]
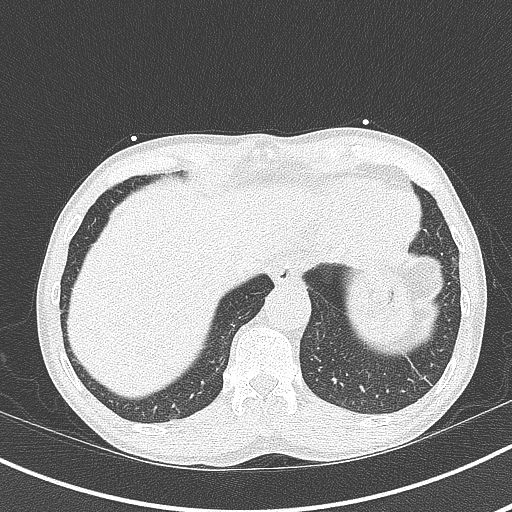
[im 29/85  lung]
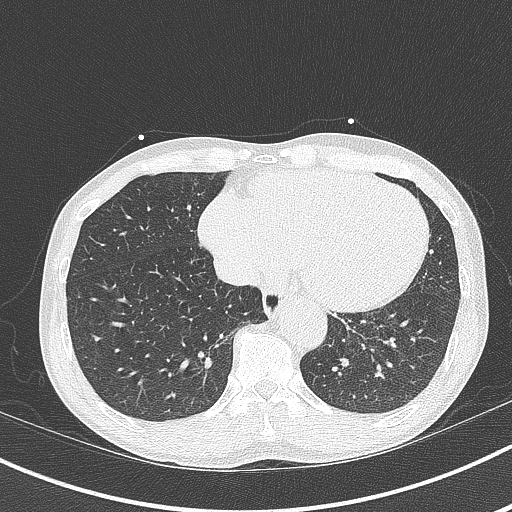
[im 43/85  lung]
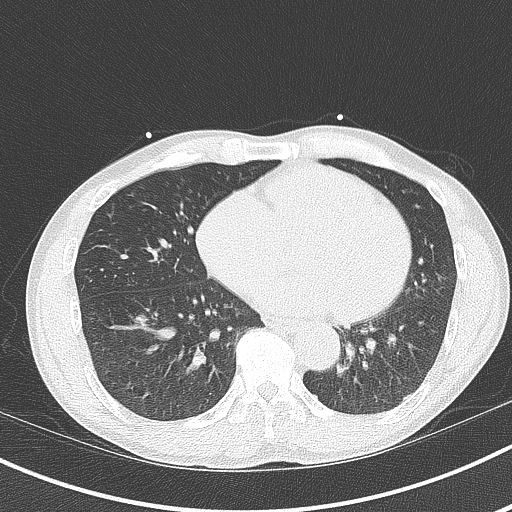
[im 57/85  lung]
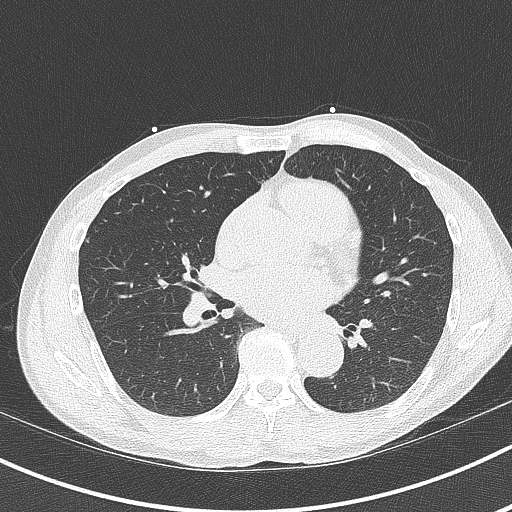
[im 71/85  lung]
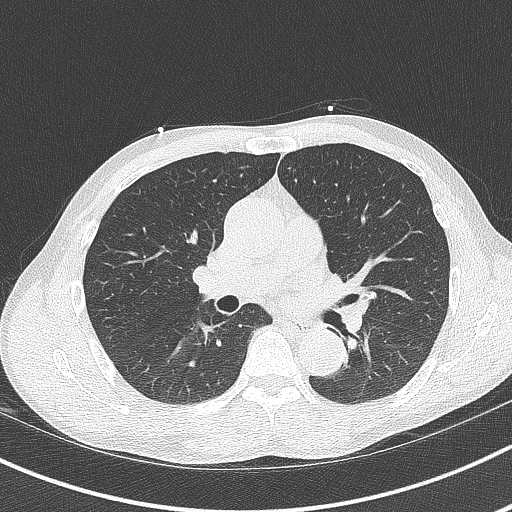

[14 of 20 positions shown; findings below may reference images not displayed]

FINDINGS: Vascular: Tortuous thoracic aorta.

Mediastinum/Nodes: No imaged thoracic adenopathy.

Lungs/Pleura: No pleural fluid. Left upper lobe calcified granuloma
of 3 mm.

Left lower lobe scarring.

Upper Abdomen: Normal imaged portions of the liver, spleen, stomach.

Musculoskeletal: No acute osseous abnormality.
IMPRESSION: No acute findings in the imaged extracardiac chest.
FINDINGS: Coronary arteries: Normal origins.

Coronary Calcium Score:

Left main: 0

Left anterior descending artery: 246

Left circumflex artery: 12

Right coronary artery:

Total: 271

Percentile: 89th

Pericardium: Normal.

Ascending Aorta: Borderline dilated at the bifurcation of the main
pulmonary artery in the double oblique view.

Non-cardiac: See separate report from [REDACTED].
IMPRESSION: Coronary calcium score of 271. This was 89th percentile for age-,
race-, and sex-matched controls.



If CAC=0, it is reasonable to withhold statin therapy and reassess
in 5 to 10 years, as long as higher risk conditions are absent
(diabetes mellitus, family history of premature CHD in first degree
relatives (males <55 years; females <65 years), cigarette smoking,
or LDL >=190 mg/dL).

If CAC is 1 to 99, it is reasonable to initiate statin therapy for
patients >=55 years of age.

If CAC is >=100 or >=75th percentile, it is reasonable to initiate
statin therapy at any age.

Cardiology referral should be considered for patients with CAC
scores >=400 or >=75th percentile.

*6562 AHA/ACC/AACVPR/AAPA/ABC/LEMA/NANTA/TIGER/Ceejay/TIGER/MATUIDI/CLOETE
Guideline on the Management of Blood Cholesterol: A Report of the
American College of Cardiology/American Heart Association Task Force
on Clinical Practice Guidelines. J Am Coll Cardiol.
8722;73(24):7154-7709.

*** End of Addendum ***
EXAM:
OVER-READ INTERPRETATION  CT CHEST

The following report is an over-read performed by radiologist Dr.
over-read does not include interpretation of cardiac or coronary
anatomy or pathology. The calcium score interpretation by the
cardiologist is attached.
FINDINGS: Vascular: Tortuous thoracic aorta.

Mediastinum/Nodes: No imaged thoracic adenopathy.

Lungs/Pleura: No pleural fluid. Left upper lobe calcified granuloma
of 3 mm.

Left lower lobe scarring.

Upper Abdomen: Normal imaged portions of the liver, spleen, stomach.

Musculoskeletal: No acute osseous abnormality.
IMPRESSION: No acute findings in the imaged extracardiac chest.

## 2023-07-25 ENCOUNTER — Telehealth: Payer: Self-pay | Admitting: Medical

## 2023-07-25 NOTE — Telephone Encounter (Signed)
LVM to  schedule a cpe- pt is pass due for a physical.
